# Patient Record
Sex: Female | Born: 1985 | Race: White | Hispanic: No | Marital: Single | Smoking: Never smoker
Health system: Southern US, Community
[De-identification: ages and names within clinical notes are randomized; demographics above are authoritative.]

## PROBLEM LIST (undated history)

## (undated) DIAGNOSIS — N189 Chronic kidney disease, unspecified: Secondary | ICD-10-CM

## (undated) DIAGNOSIS — F909 Attention-deficit hyperactivity disorder, unspecified type: Secondary | ICD-10-CM

## (undated) DIAGNOSIS — Q600 Renal agenesis, unilateral: Secondary | ICD-10-CM

## (undated) DIAGNOSIS — T7840XA Allergy, unspecified, initial encounter: Secondary | ICD-10-CM

## (undated) DIAGNOSIS — Z940 Kidney transplant status: Secondary | ICD-10-CM

## (undated) DIAGNOSIS — E010 Iodine-deficiency related diffuse (endemic) goiter: Secondary | ICD-10-CM

## (undated) HISTORY — DX: Attention-deficit hyperactivity disorder, unspecified type: F90.9

## (undated) HISTORY — DX: Chronic kidney disease, unspecified: N18.9

## (undated) HISTORY — PX: PORT-A-CATH REMOVAL: SHX5289

## (undated) HISTORY — DX: Iodine-deficiency related diffuse (endemic) goiter: E01.0

## (undated) HISTORY — PX: TUBAL LIGATION: SHX77

## (undated) HISTORY — DX: Allergy, unspecified, initial encounter: T78.40XA

## (undated) HISTORY — DX: Renal agenesis, unilateral: Q60.0

## (undated) HISTORY — DX: Kidney transplant status: Z94.0

## (undated) HISTORY — PX: WISDOM TOOTH EXTRACTION: SHX21

---
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
## 2003-01-06 ENCOUNTER — Other Ambulatory Visit: Admission: RE | Admit: 2003-01-06 | Discharge: 2003-01-06 | Payer: Self-pay | Admitting: Obstetrics & Gynecology

## 2003-03-09 ENCOUNTER — Encounter (HOSPITAL_COMMUNITY): Admission: RE | Admit: 2003-03-09 | Discharge: 2003-06-07 | Payer: Self-pay | Admitting: Nephrology

## 2003-03-17 ENCOUNTER — Emergency Department (HOSPITAL_COMMUNITY): Admission: EM | Admit: 2003-03-17 | Discharge: 2003-03-18 | Payer: Self-pay | Admitting: Emergency Medicine

## 2003-07-10 ENCOUNTER — Emergency Department (HOSPITAL_COMMUNITY): Admission: EM | Admit: 2003-07-10 | Discharge: 2003-07-10 | Payer: Self-pay | Admitting: Emergency Medicine

## 2003-07-12 ENCOUNTER — Encounter (HOSPITAL_COMMUNITY): Admission: RE | Admit: 2003-07-12 | Discharge: 2003-10-10 | Payer: Self-pay | Admitting: Nephrology

## 2003-09-02 ENCOUNTER — Ambulatory Visit (HOSPITAL_COMMUNITY): Admission: RE | Admit: 2003-09-02 | Discharge: 2003-09-02 | Payer: Self-pay | Admitting: Nephrology

## 2003-11-10 ENCOUNTER — Inpatient Hospital Stay (HOSPITAL_COMMUNITY): Admission: AD | Admit: 2003-11-10 | Discharge: 2003-11-11 | Payer: Self-pay | Admitting: Nephrology

## 2003-11-17 ENCOUNTER — Encounter (HOSPITAL_COMMUNITY): Admission: RE | Admit: 2003-11-17 | Discharge: 2004-02-15 | Payer: Self-pay | Admitting: Nephrology

## 2003-11-19 ENCOUNTER — Ambulatory Visit (HOSPITAL_COMMUNITY): Admission: RE | Admit: 2003-11-19 | Discharge: 2003-11-19 | Payer: Self-pay | Admitting: Vascular Surgery

## 2003-12-01 ENCOUNTER — Emergency Department (HOSPITAL_COMMUNITY): Admission: EM | Admit: 2003-12-01 | Discharge: 2003-12-02 | Payer: Self-pay | Admitting: Emergency Medicine

## 2004-01-11 HISTORY — PX: KIDNEY TRANSPLANT: SHX239

## 2005-08-21 ENCOUNTER — Ambulatory Visit: Payer: Self-pay | Admitting: Family Medicine

## 2005-11-01 ENCOUNTER — Ambulatory Visit: Payer: Self-pay | Admitting: Family Medicine

## 2005-12-17 ENCOUNTER — Ambulatory Visit: Payer: Self-pay | Admitting: Family Medicine

## 2006-01-23 ENCOUNTER — Ambulatory Visit: Payer: Self-pay | Admitting: Family Medicine

## 2006-01-23 ENCOUNTER — Encounter: Admission: RE | Admit: 2006-01-23 | Discharge: 2006-01-23 | Payer: Self-pay | Admitting: Family Medicine

## 2007-03-11 ENCOUNTER — Ambulatory Visit: Payer: Self-pay | Admitting: Family Medicine

## 2007-04-11 ENCOUNTER — Ambulatory Visit: Payer: Self-pay | Admitting: Family Medicine

## 2008-01-26 ENCOUNTER — Ambulatory Visit: Payer: Self-pay | Admitting: Family Medicine

## 2008-07-16 ENCOUNTER — Ambulatory Visit: Payer: Self-pay | Admitting: Family Medicine

## 2009-01-25 ENCOUNTER — Ambulatory Visit: Payer: Self-pay | Admitting: Family Medicine

## 2009-08-02 ENCOUNTER — Ambulatory Visit: Payer: Self-pay | Admitting: Family Medicine

## 2010-03-14 ENCOUNTER — Ambulatory Visit
Admission: RE | Admit: 2010-03-14 | Discharge: 2010-03-14 | Payer: Self-pay | Source: Home / Self Care | Attending: Family Medicine | Admitting: Family Medicine

## 2010-07-28 NOTE — Op Note (Signed)
NAME:  Michele Stone, Michele Stone                        ACCOUNT NO.:  [id number]   MEDICAL RECORD NO.:  [id number]                   PATIENT TYPE:  OIB   LOCATION:  2874                                 FACILITY:  [facility]   PHYSICIAN:  Quita Skye. Hart Rochester, M.D.               DATE OF BIRTH:  01-26-1986   DATE OF PROCEDURE:  11/19/2003  DATE OF DISCHARGE:  11/19/2003                                 OPERATIVE REPORT   PREOPERATIVE DIAGNOSIS:  End stage renal disease.   POSTOPERATIVE DIAGNOSIS:  End stage renal disease.   OPERATION:  1.  Bilateral ultrasound localization of internal jugular veins.  2.  Insertion Diatech catheter left internal jugular vein (28 cm).   SURGEON:  Quita Skye. Hart Rochester, M.D.   FIRST ASSISTANT:  Nurse   ANESTHESIA:  Local.   PROCEDURE:  The patient was taken to the operating room and placed in a  supine position at which time the upper chest and neck were exposed.  Both  internal jugular veins were imaged using B-mode ultrasound and both were  noted to be widely patent.  After prepping and draping in a routine sterile  manner, the left supraclavicular area was anesthetized with 1% Xylocaine.  The left internal jugular vein was entered using a supraclavicular approach  and the guide-wire passed into the right atrium under fluoroscopic guidance.  After dilating the tract appropriately, a 28 cm Diatech catheter was  positioned in the right atrium and then tunneled peripherally after removing  the peel away sheath.  The catheter was secured with nylon sutures and the  wound was closed with Vicryl in subcuticular fashion.  A sterile dressing  was applied.  The patient was taken to the recovery room in satisfactory  condition.                                               Quita Skye Hart Rochester, M.D.    JDL/MEDQ  D:  11/19/2003  T:  11/19/2003  Job:  [id number]

## 2010-07-28 NOTE — Op Note (Signed)
NAME:  Michele Stone, Michele Stone                        ACCOUNT NO.:  [id number]   MEDICAL RECORD NO.:  [id number]                   PATIENT TYPE:  AMB   LOCATION:  DAY                                  FACILITY:  [facility]   PHYSICIAN:  Excell Seltzer. Annabell Howells, M.D.                 DATE OF BIRTH:  27-Mar-1985   DATE OF PROCEDURE:  07/20/2002  DATE OF DISCHARGE:                                 OPERATIVE REPORT   PROCEDURE:  Cystoscopy with bilateral retrograde ureteropyelograms with  interpretation, insertion of a left double J stent.   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   SURGEON:  Excell Seltzer. Annabell Howells, M.D.   ANESTHESIA:  General.   DRAINS:  6 French 24 cm double J stent.   COMPLICATIONS:  None.   INDICATIONS FOR PROCEDURE:  Adeli is a 25 year old white female seen in  consultation by Dr. Sharlot Gowda for left hydronephrosis and a probable  solitary kidney with renal insufficiency. She had had urinary tract  infection a year and a half ago and then again had similar symptoms in  January but had no other problems until a recent [employer] physical when she was  found to have hypertension. Blood work demonstrated a BUN of 40 and a  creatinine of 3.7. A renal ultrasound was obtained which suggested a  solitary left kidney with hydronephrosis but compensatory hypertrophy. It  was felt that Cysto retrogrades and insertion of the stent was indicated to  see if that would improve her renal function.   DESCRIPTION OF PROCEDURE:  The patient was taken to the operating room, she  received a g of Ancef, she was placed in lithotomy position. Her perineum  and genitalia were prepped with Betadine solution and she was draped in the  usual sterile  fashion. Cystoscopy was performed using the 22 [place] scope  and the 12 and 70 degree lenses. Examination revealed a normal urethra, the  bladder wall had mild trabeculation without tumor, stones or inflammation.  The ureteral orifices were in their normal anatomic position, no urine was  noted to efflux from either orifice but she did have urine in her bladder.  The right ureteral orifice was cannulated with a 5 [place] open end catheter,  contrast was instilled. This study demonstrated an atretic ureter with blind  ending in the right upper quadrant. A left retrograde pyelogram was then  performed. This study demonstrated a fusiform dilation of the mid ureter  with some narrowing of the proximal ureter and apparent UPJ obstruction with  moderate hydronephrosis of the internal collecting system. No filling  defects were noted. The collecting system did not have the normal branching  appearance and it appeared to be  somewhat consistent with a mild rotated  kidney. I was concerned that I could not see obvious calices to where I  thought the lower pole should be. I reinspected the trigone and found no  evidence of ureteral duplication and also repeated the retrograde both with  the catheter in the distal ureter and in the kidney and no additional  filling was noted. An oblique film was taken at the end of the procedure to  help further visualize the anatomy and once again no obvious lower pole  caliceal structures were identified. After completion of the retrograde, a  wire was passed through the left kidney and a 6 French 24 cm double J stent  was inserted without difficulty. Upon removal of the wire, a good coil  formed in the kidney and in the bladder. At this point, the bladder was  drained, the patient was taken down from the lithotomy position, the oblique  films were obtained as noted above. Her anesthetic was reversed and she was  moved to the recovery room in stable condition and there  were no  complications.                                               Excell Seltzer. Annabell Howells, M.D.    JJW/MEDQ  D:  07/20/2002  T:  07/21/2002  Job:  [id number]   cc:   Sharlot Gowda, M.D.  [address] [place], [place] [address]  Fax: [phone]

## 2010-07-28 NOTE — Consult Note (Signed)
NAME:  Michele Stone, Michele Stone                        ACCOUNT NO.:  [id number]   MEDICAL RECORD NO.:  [id number]                   PATIENT TYPE:  INP   LOCATION:  0345                                 [facility]ACILITY:  [facility]   PHYSICIAN:  Dyke Maes, M.D.          DATE OF BIRTH:  1985/04/23   DATE OF CONSULTATION:  09/02/2002  DATE OF DISCHARGE:                                   CONSULTATION   REASON FOR CONSULTATION:  1. Chronic renal insufficiency.  2. Anemia.  3. Acidosis.   HISTORY OF PRESENT ILLNESS:  This is a 25 year old white female with a  history of chronic renal insufficiency secondary to a solitary function  dysplastic left kidney.  The patient was only recently found to have renal  insufficiency following a [employer] physical exam.  Her serum creatinine has been  around the 4 range.  Yesterday she developed acute onset of left flank pain  along with some suprapubic pain and fever.  This persisted until today and  was associated with some nausea and vomiting.  She has had some mild  dysuria.  She was seen by Dr. Annabell Howells in his office today and was admitted for  presumptive diagnosis of pyelonephritis.  Apparently she underwent a CT scan  of her abdomen which showed no acute process.   PAST MEDICAL HISTORY:  1. Chronic renal insufficiency.  2. Anemia secondary to chronic renal insufficiency.  3. Hypertension, most likely secondary to her chronic renal insufficiency.   ALLERGIES:  None.   MEDICATIONS:  Micardis 40 mg daily.  1. Ferrous sulfate 325 mg b.i.d.  2. Aranesp 60 mcg every other week.   SOCIAL HISTORY:  She is a nonsmoker, nondrinker.  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   FAMILY HISTORY:  No family history of end-stage renal disease, or  hypertension.  There is a family history of kidney stones.   REVIEW OF SYSTEMS:  No recent change in vision.  No shortness of breath.  No  chest pain.  No change in bowel habits.  No neuropathic  symptoms.  No new  arthritic complaints.  The rest of the review of systems is as noted in the  HPI.   PHYSICAL EXAMINATION:  VITAL SIGNS:  Blood pressure 109/61, pulse 109,  respirations 20, temperature 100.9.  GENERAL:  A pale, ill-appearing 25 year old black female somewhat somnolent,  as a result of getting IV morphine.  HEENT:  Sclerae nonicteric.  Extraocular muscles are intact.  LUNGS:  Clear to auscultation.  HEART:  Regular rate and rhythm without murmur, rub or gallop.  BACK:  Left CVA tenderness.  ABDOMEN:  Positive bowel sounds, nontender, nondistended, no  hepatosplenomegaly.  There is some mild suprapubic tenderness.  Pulses are  2/4 and equal throughout.  EXTREMITIES:  No cyanosis, clubbing or edema.  NEUROLOGIC:  Cranial nerves intact.  Motor and sensory intact.   LABORATORY DATA:  Hemoglobin 9.9,  white count 10.5, potassium 4.5, sodium  136, bicarb 15, BUN 48, creatinine 4.3, calcium 8.6.  Cultures and  urinalysis are pending.   IMPRESSION:  1. Acute pyelonephritis.  2. Chronic renal insufficiency.  3. Hypertension.  4. Anemia on Aranesp.  5. Metabolic acidosis secondary to chronic renal insufficiency.   PLAN:  1. I will add bicarb to her intravenous fluids.  2. Will hold her Micardis for now during this acute illness.  3. We will continue her Aranesp injections.  4. We will await the culture results and urinalysis/  5. I agree with beginning empiric antibiotics.  6. We will recheck laboratory in the morning.   Thank you for asking me to consult.  I will follow the patient with you.     Dyke Maes, M.D.                Dyke Maes, M.D.    MTM/MEDQ  D:  09/02/2002  T:  09/02/2002  Job:  [id number]

## 2010-10-27 ENCOUNTER — Encounter: Payer: Self-pay | Admitting: Medical

## 2010-10-27 ENCOUNTER — Ambulatory Visit (INDEPENDENT_AMBULATORY_CARE_PROVIDER_SITE_OTHER): Payer: Medicare Other | Admitting: Medical

## 2010-10-27 VITALS — BP 94/68 | HR 68 | Temp 97.5°F | Resp 20 | Ht 63.0 in | Wt 154.0 lb

## 2010-10-27 DIAGNOSIS — J020 Streptococcal pharyngitis: Secondary | ICD-10-CM

## 2010-10-27 MED ORDER — AMOXICILLIN 875 MG PO TABS: 875.0000 mg | ORAL_TABLET | Freq: Two times a day (BID) | ORAL | Status: AC

## 2010-10-27 NOTE — Progress Notes (Signed)
Subjective:   HPI Michele Stone is a 25 y.o. female who presents for 3 day history of bad sore throat.  She notes and chills, ear pressure, otherwise no symptoms. Using Tylenol when necessary.  No other aggravating or relieving factors.  No other c/o.  The following portions of the patient's history were reviewed and updated as appropriate: allergies, current medications, past family history, past medical history, past social history, past surgical history and problem list.  Past Medical History  Diagnosis Date  . ADHD (attention deficit hyperactivity disorder)   . Allergy   . Chronic renal failure   . History of renal transplant   . Thyromegaly   . Congenital single kidney     Review of Systems Gen.: No fevers, sweats Skin: No rash HEENT: No runny nose or sinus pressure or congestion Lungs: No cough, shortness of breath Abdomen: No nausea, vomiting, abdominal pain, diarrhea     Objective:   Physical Exam  General appearance: alert, no distress, WD/WN, white female, hoarse voice Skin: warm, dry HEENT: Sinuses nontender, conjunctiva pink, nares patent, TMs pearly, pharynx with erythema, tonsils 2+, no exudate Lungs: Clear to auscultation bilaterally Heart: Regular in rhythm, normal S1, S2    Assessment :    Encounter Diagnosis  Name Primary?  . Strep pharyngitis Yes     Plan:    Prescription for amoxicillin today.  Advise she is contagious for at least 24 hours on antibiotic, saltwater gargles, rest, Tylenol when necessary, discussed prevention.  Unfortunately she has 3 small children who are risk of getting this as well.  Advised if they start having symptoms to call or bring them in.

## 2010-11-06 ENCOUNTER — Other Ambulatory Visit (INDEPENDENT_AMBULATORY_CARE_PROVIDER_SITE_OTHER): Payer: Medicare Other | Admitting: Medical

## 2010-11-06 DIAGNOSIS — J029 Acute pharyngitis, unspecified: Secondary | ICD-10-CM

## 2011-03-26 DIAGNOSIS — N189 Chronic kidney disease, unspecified: Secondary | ICD-10-CM | POA: Diagnosis not present

## 2011-03-26 DIAGNOSIS — Z940 Kidney transplant status: Secondary | ICD-10-CM | POA: Diagnosis not present

## 2011-04-04 DIAGNOSIS — N189 Chronic kidney disease, unspecified: Secondary | ICD-10-CM | POA: Diagnosis not present

## 2011-05-07 DIAGNOSIS — N182 Chronic kidney disease, stage 2 (mild): Secondary | ICD-10-CM | POA: Diagnosis not present

## 2011-05-07 DIAGNOSIS — Z940 Kidney transplant status: Secondary | ICD-10-CM | POA: Diagnosis not present

## 2011-05-07 DIAGNOSIS — T8610 Unspecified complication of kidney transplant: Secondary | ICD-10-CM | POA: Diagnosis not present

## 2011-05-07 DIAGNOSIS — Z87448 Personal history of other diseases of urinary system: Secondary | ICD-10-CM | POA: Diagnosis not present

## 2011-05-07 DIAGNOSIS — Z87410 Personal history of cervical dysplasia: Secondary | ICD-10-CM | POA: Diagnosis not present

## 2011-05-07 DIAGNOSIS — E559 Vitamin D deficiency, unspecified: Secondary | ICD-10-CM | POA: Diagnosis not present

## 2011-05-07 DIAGNOSIS — Z79899 Other long term (current) drug therapy: Secondary | ICD-10-CM | POA: Diagnosis not present

## 2011-05-07 DIAGNOSIS — D899 Disorder involving the immune mechanism, unspecified: Secondary | ICD-10-CM | POA: Diagnosis not present

## 2011-05-07 DIAGNOSIS — Z8719 Personal history of other diseases of the digestive system: Secondary | ICD-10-CM | POA: Diagnosis not present

## 2011-05-07 DIAGNOSIS — Z9851 Tubal ligation status: Secondary | ICD-10-CM | POA: Diagnosis not present

## 2011-06-27 DIAGNOSIS — N946 Dysmenorrhea, unspecified: Secondary | ICD-10-CM | POA: Diagnosis not present

## 2011-06-27 DIAGNOSIS — R87619 Unspecified abnormal cytological findings in specimens from cervix uteri: Secondary | ICD-10-CM | POA: Diagnosis not present

## 2011-06-28 ENCOUNTER — Ambulatory Visit (INDEPENDENT_AMBULATORY_CARE_PROVIDER_SITE_OTHER): Payer: Medicare Other | Admitting: Family Medicine

## 2011-06-28 ENCOUNTER — Encounter: Payer: Self-pay | Admitting: Family Medicine

## 2011-06-28 VITALS — BP 98/60 | HR 92 | Temp 98.6°F | Ht 63.0 in | Wt 167.0 lb

## 2011-06-28 DIAGNOSIS — N390 Urinary tract infection, site not specified: Secondary | ICD-10-CM

## 2011-06-28 DIAGNOSIS — R3915 Urgency of urination: Secondary | ICD-10-CM

## 2011-06-28 DIAGNOSIS — N12 Tubulo-interstitial nephritis, not specified as acute or chronic: Secondary | ICD-10-CM

## 2011-06-28 DIAGNOSIS — M545 Low back pain: Secondary | ICD-10-CM

## 2011-06-28 DIAGNOSIS — Z940 Kidney transplant status: Secondary | ICD-10-CM

## 2011-06-28 DIAGNOSIS — J309 Allergic rhinitis, unspecified: Secondary | ICD-10-CM

## 2011-06-28 LAB — POCT URINALYSIS DIPSTICK
Ketones, UA: NEGATIVE
Urobilinogen, UA: NEGATIVE
pH, UA: 5

## 2011-06-28 MED ORDER — CIPROFLOXACIN HCL 500 MG PO TABS: 500.0000 mg | ORAL_TABLET | Freq: Two times a day (BID) | ORAL | Status: AC

## 2011-06-28 NOTE — Patient Instructions (Addendum)
Please ask your kidney doctor at [facility] to periodically send [place] updates.  Return in 2 weeks to have urine repeated, to ensure infection has cleared. Take antibiotics twice daily as directed.  Return immediately if nausea, vomiting, persistent fevers, unable to keep down the antibiotics.  Take either Claritin or Allegra or Zyrtec for allergies.  Sinus rinses or Netipot will also help with sinus pressure.  You may use a decongestant such as sudafed if needed--only take this if having significant sinus pain.

## 2011-06-28 NOTE — Progress Notes (Signed)
Chief complaint: thinks she may have sinus infection or allergies x 7 days. Also has fever and thinks she may have UTI, has lower back pain and urinary urgency  HPI:  Last night began with fever (>104), urinary frequency, urgency.  Denies dysuria.  Urine has looked dark, no blood seen.  Denies nausea, vomiting.  Had just slight tenderness over lower abdomen yesterday, but thought related to working out abs at gym.  Also having some L flank and low back pain since last night.  She has h/o kidney transplant (R lower abdomen), and a single, nonfunctioning kidney L side. Took Tylenol last night, didn't seem to help, fever stayed up.  Didn't take anything this morning.  Felt fever break this morning.  Also complaining of sinus pressure.  They just laid hay and grass in her front yard last week, and was fine prior to that.  Denies runny nose or sneezing, just postnasal drainage. + sore throat.  Denies cough, shortness of breath.  Benadryl allergy has helped some, and avoids going outside.  Past Medical History  Diagnosis Date  . ADHD (attention deficit hyperactivity disorder)   . Allergy   . Chronic renal failure   . History of renal transplant   . Thyromegaly   . Congenital single kidney    Past Surgical History  Procedure Date  . Kidney transplant 01/2004   History   Social History  . Marital Status: Single    Spouse Name: N/A    Number of Children: N/A  . Years of Education: N/A   Occupational History  . Not on file.   Social History Main Topics  . Smoking status: Never Smoker   . Smokeless tobacco: Never Used  . Alcohol Use: No  . Drug Use: No  . Sexually Active: Not on file   Other Topics Concern  . Not on file   Social History Narrative  . No narrative on file   Current Outpatient Prescriptions on File Prior to Visit  Medication Sig Dispense Refill  . azaTHIOprine (IMURAN) 50 MG tablet Take 75 mg by mouth daily.        . ferrous sulfate 325 (65 FE) MG tablet Take 325  mg by mouth daily with breakfast.        . folic acid (FOLVITE) 1 MG tablet Take 1 mg by mouth daily.        . Prenatal Vit-Fe Fumarate-FA (PRENATAL/FOLIC ACID) TABS Take 1 tablet by mouth daily.        . promethazine (PHENERGAN) 25 MG tablet Take 25 mg by mouth every 6 (six) hours as needed.        . selenium sulfide (SELSUN) 2.5 % shampoo Apply 1 application topically daily as needed.        . tacrolimus (PROGRAF) 1 MG capsule Take 2 mg by mouth 2 (two) times daily.       Marland Kitchen acetaminophen (TYLENOL) 325 MG tablet Take 325 mg by mouth every 6 (six) hours as needed.         Allergies  Allergen Reactions  . Myfortic Nausea And Vomiting and Other (See Comments)    Fever.  . Adhesive (Tape) Rash    ROS:  Denies headaches, nausea, vomiting, diarrhea, skin rashes. Denies cough, shortness of breath, chest pain.  See HPI:  Chart reviewed--no records from [facility] since 2009, but patient reports recent labs were within normal range.  PHYSICAL EXAM: BP 98/60  Pulse 92  Temp(Src) 98.6 F (37 C) (Oral)  Ht  5\' 3"  (1.6 m)  Wt 167 lb (75.751 kg)  BMI 29.58 kg/m2  LMP 06/08/2011 Well developed, pleasant female, in no distress, accompanied by 3 young children.  Back: +TTP L flank, and also at lower L paraspinous muscles.  Spine nontender.   Abdomen: soft.  She reports mild tenderness across lower abdomen, including RL abdomen where kidney is present.  No overlying warmth, swelling. Skin: no rash HEENT:  PERRL, conjunctiva clear.  Nasal mucosa mildly edematous, no purulence.  Sinuses nontender.  OP clear. Neck: no lymphadenopathy or thyromeglay Heart: borderline tachycardia.  No murmur, rub Lungs: clear bilaterally   Urine dip:  1+ blood, 2+ leuks, +nitrate  ASSESSMENT/PLAN: 1. Urinary urgency  POCT Urinalysis Dipstick  2. Low back pain  POCT Urinalysis Dipstick  3. UTI (lower urinary tract infection)    4. Pyelonephritis  ciprofloxacin (CIPRO) 500 MG tablet, Urine culture  5. H/O kidney  transplant    6. Allergic rhinitis, cause unspecified     Treat presumptively for possible pyelo--seems more like infection is in nonfunctioning kidney, not transplanted kidney, based on her discomfort/pain. Return in 2 weeks to have urine repeated, to ensure infection has cleared. Take antibiotics twice daily as directed.  Return immediately if nausea, vomiting, persistent fevers, unable to keep down the antibiotics.  Take either Claritin or Allegra or Zyrtec for allergies.  Sinus rinses or Netipot will also help with sinus pressure.  You may use a decongestant such as sudafed if needed--only take this if having significant sinus pain.  Appears to also have some muscular component to her back/abdominal pain.  Heat, tylenol as needed

## 2011-07-01 LAB — URINE CULTURE

## 2011-09-06 ENCOUNTER — Encounter: Payer: Self-pay | Admitting: Family Medicine

## 2011-09-20 DIAGNOSIS — N189 Chronic kidney disease, unspecified: Secondary | ICD-10-CM | POA: Diagnosis not present

## 2011-11-07 DIAGNOSIS — D899 Disorder involving the immune mechanism, unspecified: Secondary | ICD-10-CM | POA: Diagnosis not present

## 2011-11-07 DIAGNOSIS — Z8719 Personal history of other diseases of the digestive system: Secondary | ICD-10-CM | POA: Diagnosis not present

## 2011-11-07 DIAGNOSIS — Z87410 Personal history of cervical dysplasia: Secondary | ICD-10-CM | POA: Diagnosis not present

## 2011-11-07 DIAGNOSIS — N182 Chronic kidney disease, stage 2 (mild): Secondary | ICD-10-CM | POA: Diagnosis not present

## 2011-11-07 DIAGNOSIS — E559 Vitamin D deficiency, unspecified: Secondary | ICD-10-CM | POA: Diagnosis not present

## 2011-11-07 DIAGNOSIS — T8610 Unspecified complication of kidney transplant: Secondary | ICD-10-CM | POA: Diagnosis not present

## 2011-12-14 DIAGNOSIS — Z8719 Personal history of other diseases of the digestive system: Secondary | ICD-10-CM | POA: Diagnosis not present

## 2011-12-14 DIAGNOSIS — R109 Unspecified abdominal pain: Secondary | ICD-10-CM | POA: Diagnosis not present

## 2011-12-28 DIAGNOSIS — R109 Unspecified abdominal pain: Secondary | ICD-10-CM | POA: Diagnosis not present

## 2011-12-28 DIAGNOSIS — N186 End stage renal disease: Secondary | ICD-10-CM | POA: Diagnosis not present

## 2011-12-28 DIAGNOSIS — Z8719 Personal history of other diseases of the digestive system: Secondary | ICD-10-CM | POA: Diagnosis not present

## 2011-12-28 DIAGNOSIS — R12 Heartburn: Secondary | ICD-10-CM | POA: Diagnosis not present

## 2011-12-28 DIAGNOSIS — R1084 Generalized abdominal pain: Secondary | ICD-10-CM | POA: Diagnosis not present

## 2012-03-28 ENCOUNTER — Encounter: Payer: Self-pay | Admitting: Medical

## 2012-03-28 ENCOUNTER — Ambulatory Visit (INDEPENDENT_AMBULATORY_CARE_PROVIDER_SITE_OTHER): Payer: Medicare Other | Admitting: Medical

## 2012-03-28 VITALS — BP 110/70 | HR 82 | Temp 98.0°F | Resp 16 | Wt 170.0 lb

## 2012-03-28 DIAGNOSIS — E669 Obesity, unspecified: Secondary | ICD-10-CM

## 2012-03-28 DIAGNOSIS — R635 Abnormal weight gain: Secondary | ICD-10-CM

## 2012-03-28 DIAGNOSIS — Z8349 Family history of other endocrine, nutritional and metabolic diseases: Secondary | ICD-10-CM

## 2012-03-28 LAB — COMPREHENSIVE METABOLIC PANEL
ALT: <8 U/L (ref 0–35)
AST: 12 U/L (ref 0–37)
Albumin: 4.1 g/dL (ref 3.5–5.2)
Alkaline Phosphatase: 50 U/L (ref 39–117)
CO2: 26 mEq/L (ref 19–32)
Calcium: 9.6 mg/dL (ref 8.4–10.5)
Creat: 0.68 mg/dL (ref 0.50–1.10)
Total Bilirubin: 0.7 mg/dL (ref 0.3–1.2)
Total Protein: 7 g/dL (ref 6.0–8.3)

## 2012-03-28 NOTE — Progress Notes (Signed)
Subjective: Here for c/o weight gain.  She notes that despite her workout routine of 1+ hours of cardio and weight lifting daily, reportedly eating 1200 calories daily, still stagnating at 170 lb since she gave birth to her last child who is now almost 29 years old.  She thinks it may be her thyroid.   She is on medications for hx/o renal transplant from birth.   Otherwise no new medications, no other new changes in diet.  Her mom does have thyroid disease.   She does see her kidney doctor regularly and things are stable in regards to her kidney.    Past Medical History  Diagnosis Date  . ADHD (attention deficit hyperactivity disorder)   . Allergy   . Chronic renal failure   . History of renal transplant   . Thyromegaly   . Congenital single kidney    Review of Systems Constitutional: -fever, -chills, -sweats ENT: -runny nose, -ear pain, -sore throat Cardiology:  -chest pain, -palpitations, -edema Respiratory: -cough, -shortness of breath, -wheezing Gastroenterology: -abdominal pain, -nausea, -vomiting, -diarrhea, -constipation  Musculoskeletal: -arthralgias, -myalgias, -joint swelling, -back pain Ophthalmology: -vision changes Urology: -dysuria, -difficulty urinating, -hematuria, -urinary frequency, -urgency Neurology: -headache, -weakness, -tingling, -numbness    Objective: Gen: wd, wn Skin: warm, dry Neck: supple, nontender, no thyromegaly, no mass Lungs clear Heart RRR, normal S1, S2, no murmurs Abdomen +bs, soft, nontender, no mass, no organomegaly, no striae, RLQ surgical scar No obvious buffalo hump, moon facies Pulses normal  Assessment: Encounter Diagnoses  Name Primary?  . Weight gain, abnormal Yes  . Family history of thyroid disease   . Obesity    Plan: Advised 1500-1800 calorie diet, discussed healthy choices, variety of exercises, c/t daily exercise, 7-8 hours of sleep, stress reduction and labs today.  i doubt thyroid or cushing or a medical problem.  Likely  diet related.  F/u pending labs.

## 2012-04-09 DIAGNOSIS — T8610 Unspecified complication of kidney transplant: Secondary | ICD-10-CM | POA: Diagnosis not present

## 2012-04-09 DIAGNOSIS — Z87410 Personal history of cervical dysplasia: Secondary | ICD-10-CM | POA: Diagnosis not present

## 2012-04-09 DIAGNOSIS — Z8719 Personal history of other diseases of the digestive system: Secondary | ICD-10-CM | POA: Diagnosis not present

## 2012-04-09 DIAGNOSIS — E559 Vitamin D deficiency, unspecified: Secondary | ICD-10-CM | POA: Diagnosis not present

## 2012-04-09 DIAGNOSIS — D899 Disorder involving the immune mechanism, unspecified: Secondary | ICD-10-CM | POA: Diagnosis not present

## 2012-04-09 DIAGNOSIS — Z9851 Tubal ligation status: Secondary | ICD-10-CM | POA: Diagnosis not present

## 2012-04-09 DIAGNOSIS — Z8742 Personal history of other diseases of the female genital tract: Secondary | ICD-10-CM | POA: Diagnosis not present

## 2012-04-09 DIAGNOSIS — N182 Chronic kidney disease, stage 2 (mild): Secondary | ICD-10-CM | POA: Diagnosis not present

## 2012-07-18 ENCOUNTER — Ambulatory Visit (INDEPENDENT_AMBULATORY_CARE_PROVIDER_SITE_OTHER): Payer: Medicare Other | Admitting: Medical

## 2012-07-18 ENCOUNTER — Encounter: Payer: Self-pay | Admitting: Medical

## 2012-07-18 VITALS — BP 102/70 | HR 109 | Temp 97.9°F | Resp 16 | Wt 174.0 lb

## 2012-07-18 DIAGNOSIS — L259 Unspecified contact dermatitis, unspecified cause: Secondary | ICD-10-CM

## 2012-07-18 MED ORDER — HYDROCORTISONE 2.5 % EX CREA: TOPICAL_CREAM | Freq: Two times a day (BID) | CUTANEOUS | Status: AC

## 2012-07-18 NOTE — Progress Notes (Signed)
Subjective:  Michele Stone is a 27 y.o. female who presents for rash in middle of her chest.   Was bubbled up the other day, is itchy, red at times . No other rash. No other symptoms.   She is allergic to the pear trees at there mom's house, but no other know environmental allergies.  No new soaps, doesn't spray cologne on her chest.  No recent insect bites.  Used OTC benadryl cream, other OTC creams, no improvement.  No other aggravating or relieving factors.    No other c/o.  The following portions of the patient's history were reviewed and updated as appropriate: allergies, current medications, past family history, past medical history, past social history, past surgical history and problem list.  ROS Otherwise as in subjective above  Objective: Physical Exam  Vital signs reviewed  General appearance: alert, no distress, WD/WN Skin: faint rough texture skin on middle of chest, at superior border of breast cleavage, but no obvious rash, erythema, crusting, or other.  Skin normal appearing except for freckles throughout   Assessment: Encounter Diagnosis  Name Primary?  . Contact dermatitis Yes     Plan: Advised for the next few days, hydrocortisone cream topically 2.5%, ice pack for itching, benadryl oral OTC 2-3 times daily for few days, bathing daily, and if not improving, recheck.  Follow up: prn.

## 2012-08-08 DIAGNOSIS — N182 Chronic kidney disease, stage 2 (mild): Secondary | ICD-10-CM | POA: Diagnosis not present

## 2012-08-08 DIAGNOSIS — D899 Disorder involving the immune mechanism, unspecified: Secondary | ICD-10-CM | POA: Diagnosis not present

## 2012-08-08 DIAGNOSIS — T8610 Unspecified complication of kidney transplant: Secondary | ICD-10-CM | POA: Diagnosis not present

## 2012-08-08 DIAGNOSIS — Z79899 Other long term (current) drug therapy: Secondary | ICD-10-CM | POA: Diagnosis not present

## 2012-08-08 DIAGNOSIS — Z87410 Personal history of cervical dysplasia: Secondary | ICD-10-CM | POA: Diagnosis not present

## 2012-08-08 DIAGNOSIS — Z8719 Personal history of other diseases of the digestive system: Secondary | ICD-10-CM | POA: Diagnosis not present

## 2012-08-08 DIAGNOSIS — E559 Vitamin D deficiency, unspecified: Secondary | ICD-10-CM | POA: Diagnosis not present

## 2012-08-08 DIAGNOSIS — Z940 Kidney transplant status: Secondary | ICD-10-CM | POA: Diagnosis not present

## 2012-10-08 DIAGNOSIS — Z01419 Encounter for gynecological examination (general) (routine) without abnormal findings: Secondary | ICD-10-CM | POA: Diagnosis not present

## 2012-10-08 DIAGNOSIS — N946 Dysmenorrhea, unspecified: Secondary | ICD-10-CM | POA: Diagnosis not present

## 2012-10-08 DIAGNOSIS — A499 Bacterial infection, unspecified: Secondary | ICD-10-CM | POA: Diagnosis not present

## 2012-10-08 DIAGNOSIS — Z940 Kidney transplant status: Secondary | ICD-10-CM | POA: Diagnosis not present

## 2012-10-08 DIAGNOSIS — Z87891 Personal history of nicotine dependence: Secondary | ICD-10-CM | POA: Diagnosis not present

## 2012-10-08 DIAGNOSIS — N760 Acute vaginitis: Secondary | ICD-10-CM | POA: Diagnosis not present

## 2012-10-08 DIAGNOSIS — Z124 Encounter for screening for malignant neoplasm of cervix: Secondary | ICD-10-CM | POA: Diagnosis not present

## 2012-11-13 DIAGNOSIS — L819 Disorder of pigmentation, unspecified: Secondary | ICD-10-CM | POA: Diagnosis not present

## 2012-11-13 DIAGNOSIS — Z940 Kidney transplant status: Secondary | ICD-10-CM | POA: Diagnosis not present

## 2012-12-10 DIAGNOSIS — Z940 Kidney transplant status: Secondary | ICD-10-CM | POA: Diagnosis not present

## 2012-12-10 DIAGNOSIS — K219 Gastro-esophageal reflux disease without esophagitis: Secondary | ICD-10-CM | POA: Diagnosis not present

## 2012-12-10 DIAGNOSIS — T8610 Unspecified complication of kidney transplant: Secondary | ICD-10-CM | POA: Diagnosis not present

## 2012-12-10 DIAGNOSIS — D899 Disorder involving the immune mechanism, unspecified: Secondary | ICD-10-CM | POA: Diagnosis not present

## 2012-12-10 DIAGNOSIS — E559 Vitamin D deficiency, unspecified: Secondary | ICD-10-CM | POA: Diagnosis not present

## 2012-12-10 DIAGNOSIS — N189 Chronic kidney disease, unspecified: Secondary | ICD-10-CM | POA: Diagnosis not present

## 2012-12-10 DIAGNOSIS — R6889 Other general symptoms and signs: Secondary | ICD-10-CM | POA: Diagnosis not present

## 2012-12-10 DIAGNOSIS — Z87440 Personal history of urinary (tract) infections: Secondary | ICD-10-CM | POA: Diagnosis not present

## 2013-03-04 ENCOUNTER — Ambulatory Visit (INDEPENDENT_AMBULATORY_CARE_PROVIDER_SITE_OTHER): Payer: Medicare Other | Admitting: Family Medicine

## 2013-03-04 ENCOUNTER — Encounter: Payer: Self-pay | Admitting: Family Medicine

## 2013-03-04 VITALS — BP 96/68 | HR 84 | Temp 98.3°F | Ht 64.0 in | Wt 172.0 lb

## 2013-03-04 DIAGNOSIS — R52 Pain, unspecified: Secondary | ICD-10-CM | POA: Diagnosis not present

## 2013-03-04 DIAGNOSIS — J209 Acute bronchitis, unspecified: Secondary | ICD-10-CM | POA: Diagnosis not present

## 2013-03-04 MED ORDER — AMOXICILLIN-POT CLAVULANATE 875-125 MG PO TABS: 1.0000 | ORAL_TABLET | Freq: Two times a day (BID) | ORAL | Status: DC

## 2013-03-04 NOTE — Patient Instructions (Signed)
   Drink plenty of fluids Restart sinus rinses Use guaifenesin and dextromethorphan for cough (ie Mucinex DM or Robitussin DM)  With all of your coughing, you are very contagious.  Avoid exposure to others, especially elderly  Flu shot is strongly encouraged

## 2013-03-04 NOTE — Progress Notes (Signed)
Chief Complaint  Patient presents with  . Cough    and congestion, runny nose, fever,body aches and diarrhea that began Friday night starting with hoarseness. Has also had a very abnormal period this month.    5 days ago she lost her voice, but otherwise felt okay.  Over the next couple of days she started coughing, decreased appetite, didn't want to get out of bed.  She didn't have any energy; denied myalgias.  Coughs up "giant chunks of neon green goo", infrequent--very hard to get up, and often feels like she is going to vomit in trying to expectorate this.  She is having pain behind her eyes bilaterally.  Nasal mucus started as clear, then changed to "neon-green/black", now going back to a little clearer in color.  She had a fever of 104 earlier this week--today is the first time her temp is <99.  Her ears have been bugging her, which usually happens when her sinuses are congested.  Having some muffled hearing.  She has used tylenol, and no other medications (due to what she is allowed to use due to her transport).  She has been doing hot showers, steam.  Tried sinus rinse but it seemed to blocked to work.    She started with diarrhea yesterday.  She typically gets diarrhea when she takes tylenol along with her other medications.  Denies any nausea or vomiting. Her 3 kids have been sick over the last week with similar upper respiratory symptoms (no diarrhea), along with high fevers).  They were sick for 2-3 days and then got better.  Her illness has been lingering a little longer.  She hasn't had a flu shot, and refuses (no one in family has had)  Past Medical History  Diagnosis Date  . ADHD (attention deficit hyperactivity disorder)   . Allergy   . Chronic renal failure   . History of renal transplant   . Thyromegaly   . Congenital single kidney    Past Surgical History  Procedure Laterality Date  . Kidney transplant  01/2004  . Cesarean section      x3  . Wisdom tooth extraction    .  Port-a-cath removal    . Tubal ligation     History   Social History  . Marital Status: Single    Spouse Name: N/A    Number of Children: N/A  . Years of Education: N/A   Occupational History  . Not on file.   Social History Main Topics  . Smoking status: Never Smoker   . Smokeless tobacco: Never Used  . Alcohol Use: No  . Drug Use: No  . Sexual Activity: Yes    Birth Control/ Protection: Surgical     Comment: tubal ligation   Other Topics Concern  . Not on file   Social History Narrative  . No narrative on file    Current outpatient prescriptions:acetaminophen (TYLENOL) 325 MG tablet, Take 650 mg by mouth every 6 (six) hours as needed. , Disp: , Rfl: ;  azaTHIOprine (IMURAN) 50 MG tablet, Take 75 mg by mouth daily.  , Disp: , Rfl: ;  hydrocortisone 2.5 % cream, Apply topically 2 (two) times daily., Disp: 30 g, Rfl: 0;  tacrolimus (PROGRAF) 1 MG capsule, Take 2 mg by mouth 2 (two) times daily. , Disp: , Rfl:   Allergies  Allergen Reactions  . Mycophenolate Sodium Nausea And Vomiting and Other (See Comments)    Fever.  . Adhesive [Tape] Rash   ROS:  +  fevers, sinus headaches, ear plugging.  No sore throat.  +shortness of breath.  No nausea, vomiting.  No bleeding, bruising, rashes.  Denies chest pain, palpitations, or joint pains  PHYSICAL EXAM BP 96/68  Pulse 84  Temp(Src) 98.3 F (36.8 C) (Oral)  Ht 5\' 4"  (1.626 m)  Wt 172 lb (78.019 kg)  BMI 29.51 kg/m2  LMP 02/27/2013  Mildly ill appearing female, coughing frequently, and blowing nose HEENT:  PERRL, EOMI, conjunctiva clear.  TM's and EAC's normal.  Nasal mucosa mildly edematous, no erythema, clear mucus.  Sinuses nontender.  OP clear, moist mucus membranes Neck: no lymphadenopathy or mass Heart: mildly tachycardic (after coughing spell, rate 95-100). Lungs: clear bilaterally.  Coughing frequently.  No wheezes, rales or ronchi appreciated Skin: no rashes   ASSESSMENT/PLAN:  Acute bronchitis - Plan:  amoxicillin-clavulanate (AUGMENTIN) 875-125 MG per tablet  Body aches - Plan: POC Influenza A&B (Binax test)  Flu shot strongly encouraged, pt declines.  Flu test was negative.  She states she always runs high fevers. Her children got over illness very quickly (2-3 days), so not likely flu, just viral syndrome  Drink plenty of fluids Restart sinus rinses Use guaifenesin and dextromethorphan for cough (ie Mucinex DM or Robitussin DM). Her list of approved meds were reviewed, and these are fine, including ABX choice.   F/u if persistent fevers, coughing, shortness of breath, or other concerns develop

## 2013-04-08 DIAGNOSIS — D899 Disorder involving the immune mechanism, unspecified: Secondary | ICD-10-CM | POA: Diagnosis not present

## 2013-04-08 DIAGNOSIS — R6889 Other general symptoms and signs: Secondary | ICD-10-CM | POA: Diagnosis not present

## 2013-04-08 DIAGNOSIS — N186 End stage renal disease: Secondary | ICD-10-CM | POA: Diagnosis not present

## 2013-04-08 DIAGNOSIS — Z79899 Other long term (current) drug therapy: Secondary | ICD-10-CM | POA: Diagnosis not present

## 2013-04-08 DIAGNOSIS — Z940 Kidney transplant status: Secondary | ICD-10-CM | POA: Diagnosis not present

## 2013-04-08 DIAGNOSIS — Z48298 Encounter for aftercare following other organ transplant: Secondary | ICD-10-CM | POA: Diagnosis not present

## 2013-07-16 DIAGNOSIS — T8610 Unspecified complication of kidney transplant: Secondary | ICD-10-CM | POA: Diagnosis not present

## 2013-07-16 DIAGNOSIS — Z940 Kidney transplant status: Secondary | ICD-10-CM | POA: Diagnosis not present

## 2013-07-16 DIAGNOSIS — Z79899 Other long term (current) drug therapy: Secondary | ICD-10-CM | POA: Diagnosis not present

## 2013-11-19 ENCOUNTER — Ambulatory Visit (INDEPENDENT_AMBULATORY_CARE_PROVIDER_SITE_OTHER): Payer: Medicare Other | Admitting: Family Medicine

## 2013-11-19 ENCOUNTER — Encounter: Payer: Self-pay | Admitting: Family Medicine

## 2013-11-19 VITALS — BP 100/60 | HR 93 | Wt 181.0 lb

## 2013-11-19 DIAGNOSIS — F919 Conduct disorder, unspecified: Secondary | ICD-10-CM

## 2013-11-19 DIAGNOSIS — R4689 Other symptoms and signs involving appearance and behavior: Secondary | ICD-10-CM

## 2013-11-19 NOTE — Progress Notes (Signed)
   Subjective:    Patient ID: Michele Stone, female    DOB: April 17, 1985, 28 y.o.   MRN: [id number]  HPI She is here for consult concerning difficulty with behavior problems with her 2 older children. This has been going on for quite some time. She has tried various behavior techniques, none of which have been helpful. Apparently the children do quite well in school and there have been no behavior issues mentioned from school. The children apparently throw temper tantrums. When she initiates a behavior techniques, their usual responses I don't care. Her husband is gone out of town working so the behavior is usually mainly for her.   Review of Systems     Objective:   Physical Exam Alert and in no distress otherwise not examined.       Assessment & Plan:  Behavior problem in child  I discussed various behavior techniques with her including timeout, not allowing the children to engage continual she to avoid fighting also discussed getting them to buy into rewards and placements by choosing between several different rewards or placements. I Will also refer to Dr. Inda Coke, [phone] for further help with this matter

## 2013-11-26 DIAGNOSIS — Z79899 Other long term (current) drug therapy: Secondary | ICD-10-CM | POA: Diagnosis not present

## 2013-11-26 DIAGNOSIS — Z940 Kidney transplant status: Secondary | ICD-10-CM | POA: Diagnosis not present

## 2013-11-26 DIAGNOSIS — D899 Disorder involving the immune mechanism, unspecified: Secondary | ICD-10-CM | POA: Diagnosis not present

## 2013-11-26 DIAGNOSIS — B079 Viral wart, unspecified: Secondary | ICD-10-CM | POA: Diagnosis not present

## 2013-11-26 DIAGNOSIS — Z87410 Personal history of cervical dysplasia: Secondary | ICD-10-CM | POA: Diagnosis not present

## 2013-11-26 DIAGNOSIS — L819 Disorder of pigmentation, unspecified: Secondary | ICD-10-CM | POA: Diagnosis not present

## 2013-11-26 DIAGNOSIS — T8610 Unspecified complication of kidney transplant: Secondary | ICD-10-CM | POA: Diagnosis not present

## 2013-11-26 DIAGNOSIS — R109 Unspecified abdominal pain: Secondary | ICD-10-CM | POA: Diagnosis not present

## 2013-11-26 DIAGNOSIS — N186 End stage renal disease: Secondary | ICD-10-CM | POA: Diagnosis not present

## 2013-11-30 DIAGNOSIS — Z48298 Encounter for aftercare following other organ transplant: Secondary | ICD-10-CM | POA: Diagnosis not present

## 2013-11-30 DIAGNOSIS — N390 Urinary tract infection, site not specified: Secondary | ICD-10-CM | POA: Diagnosis not present

## 2013-11-30 DIAGNOSIS — Z940 Kidney transplant status: Secondary | ICD-10-CM | POA: Diagnosis not present

## 2014-03-08 DIAGNOSIS — Z3041 Encounter for surveillance of contraceptive pills: Secondary | ICD-10-CM | POA: Diagnosis not present

## 2014-03-08 DIAGNOSIS — N946 Dysmenorrhea, unspecified: Secondary | ICD-10-CM | POA: Diagnosis not present

## 2014-04-08 DIAGNOSIS — Z87440 Personal history of urinary (tract) infections: Secondary | ICD-10-CM | POA: Diagnosis not present

## 2014-04-08 DIAGNOSIS — N186 End stage renal disease: Secondary | ICD-10-CM | POA: Diagnosis not present

## 2014-04-08 DIAGNOSIS — D899 Disorder involving the immune mechanism, unspecified: Secondary | ICD-10-CM | POA: Diagnosis not present

## 2014-04-08 DIAGNOSIS — Z862 Personal history of diseases of the blood and blood-forming organs and certain disorders involving the immune mechanism: Secondary | ICD-10-CM | POA: Diagnosis not present

## 2014-04-08 DIAGNOSIS — Z87410 Personal history of cervical dysplasia: Secondary | ICD-10-CM | POA: Diagnosis not present

## 2014-04-08 DIAGNOSIS — Z940 Kidney transplant status: Secondary | ICD-10-CM | POA: Diagnosis not present

## 2014-04-08 DIAGNOSIS — Z79899 Other long term (current) drug therapy: Secondary | ICD-10-CM | POA: Diagnosis not present

## 2014-04-08 DIAGNOSIS — T8610 Unspecified complication of kidney transplant: Secondary | ICD-10-CM | POA: Diagnosis not present

## 2014-04-08 DIAGNOSIS — Z4822 Encounter for aftercare following kidney transplant: Secondary | ICD-10-CM | POA: Diagnosis not present

## 2014-05-11 ENCOUNTER — Telehealth: Payer: Self-pay | Admitting: Internal Medicine

## 2014-05-11 NOTE — Telephone Encounter (Signed)
Faxed over medical records to [facility] @ [phone] 

## 2014-06-03 DIAGNOSIS — Z9851 Tubal ligation status: Secondary | ICD-10-CM | POA: Diagnosis not present

## 2014-06-03 DIAGNOSIS — Z4822 Encounter for aftercare following kidney transplant: Secondary | ICD-10-CM | POA: Diagnosis not present

## 2014-06-03 DIAGNOSIS — Z862 Personal history of diseases of the blood and blood-forming organs and certain disorders involving the immune mechanism: Secondary | ICD-10-CM | POA: Diagnosis not present

## 2014-06-03 DIAGNOSIS — N189 Chronic kidney disease, unspecified: Secondary | ICD-10-CM | POA: Diagnosis not present

## 2014-06-03 DIAGNOSIS — Z79899 Other long term (current) drug therapy: Secondary | ICD-10-CM | POA: Diagnosis not present

## 2014-06-03 DIAGNOSIS — Z87440 Personal history of urinary (tract) infections: Secondary | ICD-10-CM | POA: Diagnosis not present

## 2014-06-03 DIAGNOSIS — Z87898 Personal history of other specified conditions: Secondary | ICD-10-CM | POA: Diagnosis not present

## 2014-06-03 DIAGNOSIS — Z940 Kidney transplant status: Secondary | ICD-10-CM | POA: Diagnosis not present

## 2014-06-03 DIAGNOSIS — D899 Disorder involving the immune mechanism, unspecified: Secondary | ICD-10-CM | POA: Diagnosis not present

## 2014-06-08 DIAGNOSIS — Z0279 Encounter for issue of other medical certificate: Secondary | ICD-10-CM

## 2014-10-26 ENCOUNTER — Telehealth: Payer: Self-pay

## 2014-10-26 NOTE — Telephone Encounter (Signed)
Have her set up a med check appointment 

## 2014-10-26 NOTE — Telephone Encounter (Signed)
Pt called saying she needs a new referral for her dermatologist. Wants to know if she needs to come in for the referral. Her Dermatologist is Dr. Elwin Sleight at [facility].

## 2014-10-27 NOTE — Telephone Encounter (Signed)
Left word for word message on pt machine 

## 2014-11-04 ENCOUNTER — Encounter: Payer: Self-pay | Admitting: Family Medicine

## 2014-11-04 ENCOUNTER — Ambulatory Visit (INDEPENDENT_AMBULATORY_CARE_PROVIDER_SITE_OTHER): Payer: Medicaid Other | Admitting: Family Medicine

## 2014-11-04 VITALS — BP 90/60 | HR 65 | Wt 181.0 lb

## 2014-11-04 DIAGNOSIS — Z79899 Other long term (current) drug therapy: Secondary | ICD-10-CM | POA: Diagnosis not present

## 2014-11-04 DIAGNOSIS — Z940 Kidney transplant status: Secondary | ICD-10-CM | POA: Insufficient documentation

## 2014-11-04 NOTE — Progress Notes (Signed)
   Subjective:    Patient ID: Michele Stone, female    DOB: 10/13/1985, 29 y.o.   MRN: [id number]  HPI She is here for consult concerning referral to dermatology. She does have underlying history of kidney transplant and presently is on immunosuppressive medications requiring dermatologic follow-up. She has been doing this yearly. She is doing quite well on her medications and has no particular concerns or complaints.   Review of Systems     Objective:   Physical Exam Alert and in no distress otherwise not examined       Assessment & Plan:  H/O kidney transplant  Immunosuppressive management encounter following kidney transplant - Plan: Ambulatory referral to Dermatology

## 2014-12-14 ENCOUNTER — Encounter: Payer: Self-pay | Admitting: Family Medicine

## 2015-01-19 ENCOUNTER — Encounter: Payer: Self-pay | Admitting: Medical

## 2015-01-19 ENCOUNTER — Ambulatory Visit (INDEPENDENT_AMBULATORY_CARE_PROVIDER_SITE_OTHER): Payer: Medicaid Other | Admitting: Medical

## 2015-01-19 VITALS — BP 110/72 | HR 100 | Resp 16 | Wt 177.0 lb

## 2015-01-19 DIAGNOSIS — Z940 Kidney transplant status: Secondary | ICD-10-CM

## 2015-01-19 DIAGNOSIS — R5382 Chronic fatigue, unspecified: Secondary | ICD-10-CM | POA: Diagnosis not present

## 2015-01-19 DIAGNOSIS — R1084 Generalized abdominal pain: Secondary | ICD-10-CM | POA: Diagnosis not present

## 2015-01-19 LAB — POCT URINALYSIS DIPSTICK
Bilirubin, UA: NEGATIVE
Glucose, UA: NEGATIVE
PH UA: 6
UROBILINOGEN UA: NEGATIVE

## 2015-01-19 MED ORDER — CIPROFLOXACIN HCL 500 MG PO TABS
500.0000 mg | ORAL_TABLET | Freq: Two times a day (BID) | ORAL | Status: AC
Start: 2015-01-19 — End: ?

## 2015-01-19 NOTE — Progress Notes (Signed)
Subjective: Chief Complaint  Patient presents with  . mult. issues    no fever, but will wake up drenched in sweat. Doesn't feel good. Saw nephrologist, all they saw was that the RBC count was low. Gets lightheaded easily. Body temp changes quickly. "feels like i've been punched" 6-7/10 pain  . skin cancer    had "basal cell carcinoma nodular type" removed from face last week   Here for not feeling good.  Hasn't felt right the last 2 weeks.   Left school early the other day not feeling right.  Took some tylenol when period started 2 days ago.   She reports pain in right lower quadrant where her kidney transplant resides.  No swelling, but feels bruised.  Had pain all day yesterday, feels drained and not herself, has no energy.  She called her transplant doctor yesterday, had labs which showed slightly low RBC, but BMET and CBC otherwise normal.   Been having some runny nose, cough, coughing to the point of vomiting at times.  No fever, but awakes with chills, but + sweats.   No back pain.  No joint swelling.  No urinary.  No blood.  Having bad cramps with menstruation.   No hx/o pelvic organ issues such as ovarian cysts or fibroids.  Last UTI long time ago.  No diarrhea.  No nausea or vomiting.  No other aggravating or relieving factors. No other complaint.  Past Medical History  Diagnosis Date  . ADHD (attention deficit hyperactivity disorder)   . Allergy   . Chronic renal failure   . History of renal transplant   . Thyromegaly   . Congenital single kidney    ROS as in subjective   Objective: BP 110/72 mmHg  Pulse 100  Resp 16  Wt 177 lb (80.287 kg)  Gen: wd, wn, nad Skin: warm, dry Heart: RRR, normal s1, s2, no murmurs Lungs clear HENT - nares with congestion, otherwise unremarkable Back; non tender Abdomen: +BS, soft, surgical scar RLQ, and fullness in RLQ c/w transplanted kidney, tender RLQ, but no swelling, no other mass, tenderness or organomegaly Ext: no  edema   Assessment: Encounter Diagnoses  Name Primary?  . Chronic fatigue Yes  . Generalized abdominal pain   . History of renal transplant     Plan: Etiology likely UTI given symptoms and exam.   Reviewed the labs done by transplant team yesterday.   Reviewed them on her phone.   UA abnormal today.  She also has URI symptoms.  Begin Cipro, hydrate well, rest, and will send urine for culture.   reviewed her list of approved medications from transplant team. F/u pending labs

## 2015-01-21 LAB — URINE CULTURE

## 2015-01-24 ENCOUNTER — Telehealth: Payer: Self-pay

## 2015-01-24 NOTE — Telephone Encounter (Signed)
Pt informed that her appt has been canceled and she does not have to come in since she is doing so much better per shane

## 2015-01-26 ENCOUNTER — Ambulatory Visit: Payer: Medicaid Other | Admitting: Medical

## 2015-02-21 ENCOUNTER — Encounter: Payer: Self-pay | Admitting: Family Medicine

## 2015-02-21 ENCOUNTER — Ambulatory Visit
Admission: RE | Admit: 2015-02-21 | Discharge: 2015-02-21 | Disposition: A | Discharge status: Home / Self Care | Payer: Medicaid Other | Source: Ambulatory Visit | Attending: Family Medicine | Admitting: Family Medicine

## 2015-02-21 ENCOUNTER — Ambulatory Visit (INDEPENDENT_AMBULATORY_CARE_PROVIDER_SITE_OTHER): Payer: Medicaid Other | Admitting: Family Medicine

## 2015-02-21 VITALS — BP 110/78 | HR 72 | Temp 98.6°F | Ht 64.0 in | Wt 178.9 lb

## 2015-02-21 DIAGNOSIS — R0781 Pleurodynia: Secondary | ICD-10-CM | POA: Diagnosis not present

## 2015-02-21 DIAGNOSIS — Z940 Kidney transplant status: Secondary | ICD-10-CM

## 2015-02-21 NOTE — Progress Notes (Signed)
   Subjective:    Patient ID: Michele Stone, female    DOB: [birth year], 29 y.o.   MRN: [id number]  HPI  she has a 4 day history of symptoms started with nasal congestion followed by cough and now right-sided chest pain. No sore throat, earache, fever or chills. She did have blood work drawn 2 weeks ago for her underlying treatment for kidney transplant and apparently the tests were normal.   Review of Systems     Objective:   Physical Exam Alert and in no distress. Tympanic membranes and canals are normal. Pharyngeal area is normal. Neck is supple without adenopathy or thyromegaly. Cardiac exam shows a regular sinus rhythm without murmurs or gallops. Lungs are clear to auscultation. No chest wall tenderness noted.        Assessment & Plan:  Pleuritic chest pain - Plan: DG Chest 2 View  H/O kidney transplant  the chest pain is bothersome especially in the present situation. I will wait on the results before further evaluation and treatment

## 2016-07-24 IMAGING — CR DG CHEST 2V
2 series · 2 of 2 positions shown · non-contrast
Comparison: January 23, 2006.

CLINICAL DATA: Cough, right-sided pleuritic chest pain.

EXAM:
CHEST  2 VIEW

[w chest pa]
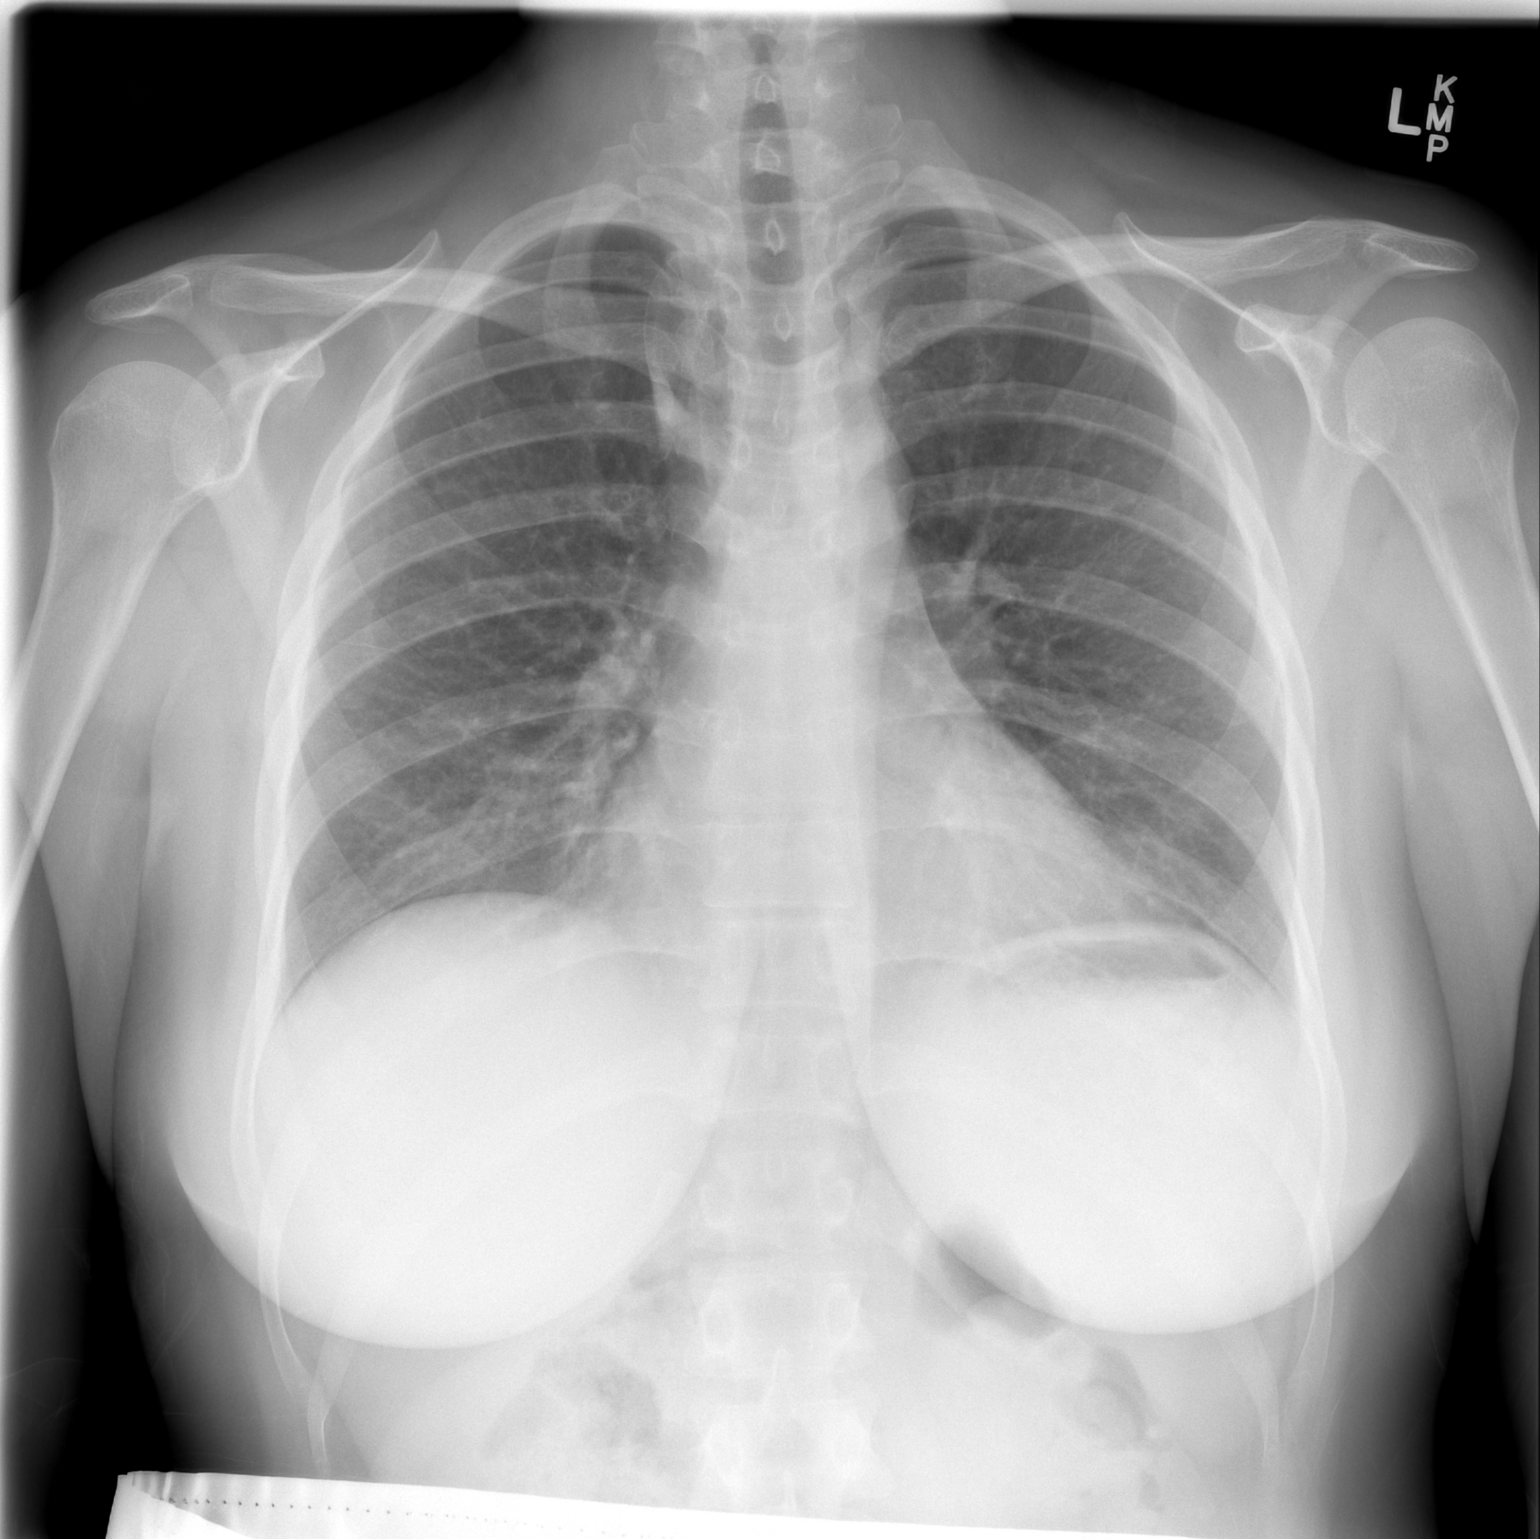

[w chest lat]
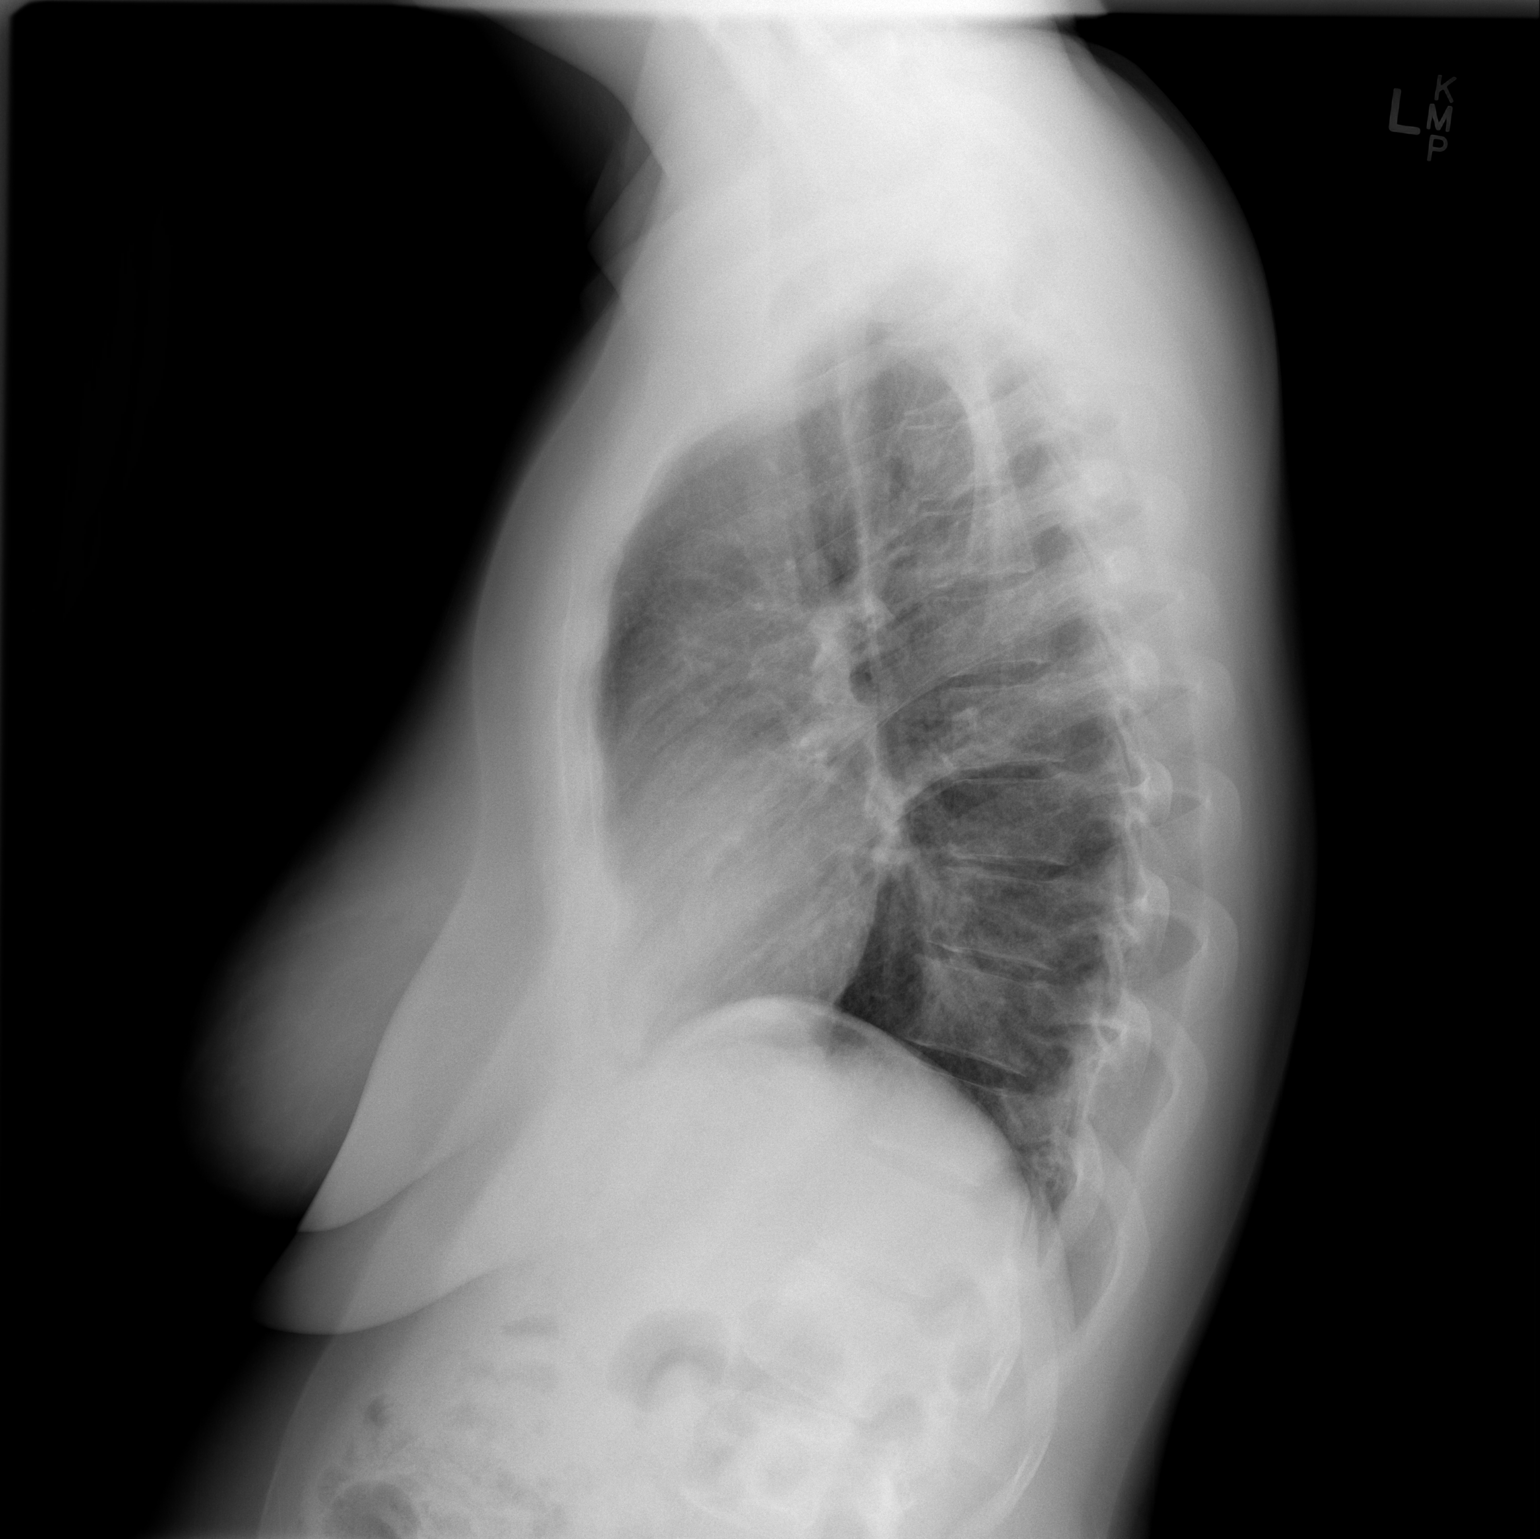

[2 of 2 positions shown; findings below may reference images not displayed]

FINDINGS: The heart size and mediastinal contours are within normal limits.
Both lungs are clear. No pneumothorax or pleural effusion is noted.
The visualized skeletal structures are unremarkable.
IMPRESSION: No active cardiopulmonary disease.

## 2016-09-06 ENCOUNTER — Encounter (HOSPITAL_COMMUNITY): Payer: Self-pay | Admitting: *Deleted

## 2016-09-06 ENCOUNTER — Ambulatory Visit (HOSPITAL_COMMUNITY)
Admission: EM | Admit: 2016-09-06 | Discharge: 2016-09-06 | Disposition: A | Discharge status: Home / Self Care | Payer: Medicaid Other | Attending: Internal Medicine | Admitting: Internal Medicine

## 2016-09-06 DIAGNOSIS — Z940 Kidney transplant status: Secondary | ICD-10-CM | POA: Insufficient documentation

## 2016-09-06 DIAGNOSIS — Z3202 Encounter for pregnancy test, result negative: Secondary | ICD-10-CM

## 2016-09-06 DIAGNOSIS — Z888 Allergy status to other drugs, medicaments and biological substances status: Secondary | ICD-10-CM | POA: Insufficient documentation

## 2016-09-06 DIAGNOSIS — Z79899 Other long term (current) drug therapy: Secondary | ICD-10-CM | POA: Insufficient documentation

## 2016-09-06 DIAGNOSIS — N390 Urinary tract infection, site not specified: Secondary | ICD-10-CM | POA: Insufficient documentation

## 2016-09-06 DIAGNOSIS — F909 Attention-deficit hyperactivity disorder, unspecified type: Secondary | ICD-10-CM | POA: Insufficient documentation

## 2016-09-06 DIAGNOSIS — R350 Frequency of micturition: Secondary | ICD-10-CM | POA: Insufficient documentation

## 2016-09-06 DIAGNOSIS — Z9889 Other specified postprocedural states: Secondary | ICD-10-CM | POA: Insufficient documentation

## 2016-09-06 DIAGNOSIS — R300 Dysuria: Secondary | ICD-10-CM | POA: Insufficient documentation

## 2016-09-06 LAB — POCT URINALYSIS DIP (DEVICE)
BILIRUBIN URINE: NEGATIVE
Glucose, UA: NEGATIVE mg/dL
KETONES UR: NEGATIVE mg/dL
NITRITE: NEGATIVE
Protein, ur: NEGATIVE mg/dL
SPECIFIC GRAVITY, URINE: 1.01 (ref 1.005–1.030)
Urobilinogen, UA: 0.2 mg/dL (ref 0.0–1.0)
pH: 7 (ref 5.0–8.0)

## 2016-09-06 LAB — POCT PREGNANCY, URINE: Preg Test, Ur: NEGATIVE

## 2016-09-06 MED ORDER — CEPHALEXIN 500 MG PO CAPS: 500.0000 mg | ORAL_CAPSULE | Freq: Four times a day (QID) | ORAL | 0 refills | Status: AC

## 2016-09-06 MED ORDER — PHENAZOPYRIDINE HCL 200 MG PO TABS: 200.0000 mg | ORAL_TABLET | Freq: Three times a day (TID) | ORAL | 0 refills | Status: AC

## 2016-09-06 MED ORDER — PHENAZOPYRIDINE HCL 200 MG PO TABS: 200.0000 mg | ORAL_TABLET | Freq: Three times a day (TID) | ORAL | 0 refills | Status: DC

## 2016-09-06 MED ORDER — CEPHALEXIN 500 MG PO CAPS: 500.0000 mg | ORAL_CAPSULE | Freq: Four times a day (QID) | ORAL | 0 refills | Status: DC

## 2016-09-06 NOTE — ED Provider Notes (Signed)
CSN: [id number]     Arrival date & time 09/06/16  1503 History   None    Chief Complaint  Patient presents with  . Urinary Tract Infection   (Consider location/radiation/quality/duration/timing/severity/associated sxs/prior Treatment) Patient c/o dysuria and urinary frequency.   The history is provided by the patient.  Urinary Tract Infection  Pain quality:  Aching Pain severity:  Mild Onset quality:  Sudden Duration:  2 days Timing:  Constant Progression:  Worsening Chronicity:  New Recent urinary tract infections: no   Relieved by:  Nothing Worsened by:  Nothing Ineffective treatments:  None tried   Past Medical History:  Diagnosis Date  . ADHD (attention deficit hyperactivity disorder)   . Allergy   . Chronic renal failure   . Congenital single kidney   . History of renal transplant   . Thyromegaly    Past Surgical History:  Procedure Laterality Date  . CESAREAN SECTION     x3  . KIDNEY TRANSPLANT  01/2004  . PORT-A-CATH REMOVAL    . TUBAL LIGATION    . WISDOM TOOTH EXTRACTION     History reviewed. No pertinent family history. Social History  Substance Use Topics  . Smoking status: Never Smoker  . Smokeless tobacco: Never Used  . Alcohol use No   OB History    No data available     Review of Systems  Constitutional: Negative.   HENT: Negative.   Eyes: Negative.   Respiratory: Negative.   Cardiovascular: Negative.   Gastrointestinal: Negative.   Endocrine: Negative.   Genitourinary: Positive for dysuria.  Allergic/Immunologic: Negative.   Neurological: Negative.     Allergies  Mycophenolate sodium; Adhesive [tape]; and Nickel  Home Medications   Prior to Admission medications   Medication Sig Start Date End Date Taking? Authorizing Provider  acetaminophen (TYLENOL) 325 MG tablet Take 650 mg by mouth every 6 (six) hours as needed.     [provider]  azaTHIOprine (IMURAN) 50 MG tablet Take 75 mg by mouth daily.      [provider]  cephALEXin (KEFLEX) 500 MG capsule Take 1 capsule (500 mg total) by mouth 4 (four) times daily. 09/06/16   Deatra Canter, FNP  ciprofloxacin (CIPRO) 500 MG tablet Take 1 tablet (500 mg total) by mouth 2 (two) times daily. Patient not taking: Reported on 02/21/2015 01/19/15   Tysinger, Kermit Balo, PA-C  hydrocortisone 2.5 % cream Apply topically 2 (two) times daily. 07/18/12   Tysinger, Kermit Balo, PA-C  Norethindrone Acetate-Ethinyl Estrad-FE (MICROGESTIN 24 FE) 1-20 MG-MCG(24) tablet Take 1 tablet by mouth daily.    [provider]  phenazopyridine (PYRIDIUM) 200 MG tablet Take 1 tablet (200 mg total) by mouth 3 (three) times daily. 09/06/16   Deatra Canter, FNP  tacrolimus (PROGRAF) 1 MG capsule Take 2 mg by mouth 2 (two) times daily.     [provider]   Meds Ordered and Administered this Visit  Medications - No data to display  BP 132/70 (BP Location: Right Arm)   Pulse 78   Temp 97.6 F (36.4 C) (Oral)   Resp 18   LMP 09/03/2016   SpO2 100%  No data found.   Physical Exam  Constitutional: She appears well-developed and well-nourished.  HENT:  Head: Normocephalic and atraumatic.  Eyes: Conjunctivae and EOM are normal. Pupils are equal, round, and reactive to light.  Neck: Normal range of motion.  Cardiovascular: Normal rate, regular rhythm and normal heart sounds.   Pulmonary/Chest: Effort normal  and breath sounds normal.  Abdominal: Soft.  Nursing note and vitals reviewed.   Urgent Care Course     Procedures (including critical care time)  Labs Review Labs Reviewed  POCT URINALYSIS DIP (DEVICE) - Abnormal; Notable for the following:       Result Value   Hgb urine dipstick SMALL (*)    Leukocytes, UA LARGE (*)    All other components within normal limits  URINE CULTURE  POCT PREGNANCY, URINE    Imaging Review No results found.   Visual Acuity Review  Right Eye Distance:   Left Eye Distance:   Bilateral Distance:    Right  Eye Near:   Left Eye Near:    Bilateral Near:         MDM   1. Lower urinary tract infectious disease   2. Dysuria    Keflex 500mg  one po qid x 7 days #28 Pyridium 200mg  one po tid x 2days #6  UA cx      Deatra CanterOxford, Devon Pretty J, FNP 09/06/16 1558

## 2016-09-06 NOTE — ED Triage Notes (Signed)
Pt  Has  A  History  Of  Kidney  Transplant   She  Reports   Symptoms      Of  Dysuria    And     Scanty  Output  With        Discomfort         In her  r  Flank     She  Reports     Urinary  Symptoms     Began  sev  Weeks  Ago  But  The  Flank  Pain started  Today

## 2016-09-07 LAB — URINE CULTURE

## 2016-11-26 LAB — HM PAP SMEAR

## 2018-01-01 ENCOUNTER — Telehealth: Payer: Self-pay | Admitting: Family Medicine

## 2018-01-01 NOTE — Telephone Encounter (Signed)
Received requested records from [facility]. Sending back for review.

## 2018-03-26 ENCOUNTER — Ambulatory Visit: Payer: BLUE CROSS/BLUE SHIELD | Admitting: Family Medicine

## 2018-03-26 ENCOUNTER — Encounter: Payer: Self-pay | Admitting: Family Medicine

## 2018-03-26 VITALS — BP 110/70 | HR 78 | Temp 97.7°F | Wt 183.2 lb

## 2018-03-26 DIAGNOSIS — T24212A Burn of second degree of left thigh, initial encounter: Secondary | ICD-10-CM | POA: Diagnosis not present

## 2018-03-26 DIAGNOSIS — Z940 Kidney transplant status: Secondary | ICD-10-CM | POA: Diagnosis not present

## 2018-03-26 DIAGNOSIS — Z79899 Other long term (current) drug therapy: Secondary | ICD-10-CM | POA: Diagnosis not present
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
## 2018-03-28 ENCOUNTER — Telehealth: Payer: Self-pay | Admitting: Family Medicine

## 2018-03-28 MED ORDER — SILVER SULFADIAZINE 1 % EX CREA: 1.0000 "application " | TOPICAL_CREAM | Freq: Every day | CUTANEOUS | 0 refills | Status: AC
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
## 2018-03-28 NOTE — Telephone Encounter (Signed)
[facility] [place]

## 2018-03-31 ENCOUNTER — Encounter: Payer: Self-pay | Admitting: Family Medicine

## 2018-03-31 ENCOUNTER — Ambulatory Visit: Payer: BLUE CROSS/BLUE SHIELD | Admitting: Family Medicine

## 2018-03-31 VITALS — BP 112/76 | HR 98 | Temp 97.9°F | Wt 181.4 lb

## 2018-03-31 DIAGNOSIS — T24212A Burn of second degree of left thigh, initial encounter: Secondary | ICD-10-CM
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
## 2018-11-05 ENCOUNTER — Other Ambulatory Visit: Payer: Self-pay

## 2018-11-05 ENCOUNTER — Ambulatory Visit (INDEPENDENT_AMBULATORY_CARE_PROVIDER_SITE_OTHER): Payer: 59 | Admitting: Family Medicine

## 2018-11-05 ENCOUNTER — Encounter: Payer: Self-pay | Admitting: Family Medicine

## 2018-11-05 VITALS — Temp 101.0°F | Wt 181.0 lb

## 2018-11-05 DIAGNOSIS — N3000 Acute cystitis without hematuria: Secondary | ICD-10-CM

## 2018-11-05 DIAGNOSIS — Z940 Kidney transplant status: Secondary | ICD-10-CM

## 2018-11-05 MED ORDER — SULFAMETHOXAZOLE-TRIMETHOPRIM 800-160 MG PO TABS: 1.0000 | ORAL_TABLET | Freq: Two times a day (BID) | ORAL | 0 refills | Status: AC

## 2018-11-05 NOTE — Progress Notes (Signed)
   Subjective:    Patient ID: Namya L [place], female    DOB: Aug 05, 1985, 33 y.o.   MRN: [id number]  HPI She woke up early this morning with a fever of 103, urgency and frequency.  She has a previous difficulty with UTIs.  She was seen by her transplant physician yesterday and apparently all the blood work looks good.   Review of Systems     Objective:   Physical Exam Alert and in no distress otherwise not examined       Assessment & Plan:  Acute cystitis without hematuria - Plan: sulfamethoxazole-trimethoprim (BACTRIM DS) 800-160 MG tablet  H/O kidney transplant Under the circumstances I think treatment is totally appropriate.  She will call if she has further difficulty.  Also recommended using Azo-Standard as well as Tylenol for the fever.

## 2018-12-31 ENCOUNTER — Other Ambulatory Visit: Payer: Self-pay

## 2018-12-31 DIAGNOSIS — Z20822 Contact with and (suspected) exposure to covid-19: Secondary | ICD-10-CM

## 2019-01-02 LAB — NOVEL CORONAVIRUS, NAA: SARS-CoV-2, NAA: NOT DETECTED

## 2021-11-15 ENCOUNTER — Encounter: Payer: Self-pay | Admitting: Internal Medicine

## 2021-12-19 ENCOUNTER — Encounter: Payer: Self-pay | Admitting: Internal Medicine

## 2022-01-01 ENCOUNTER — Encounter: Payer: Self-pay | Admitting: Internal Medicine

## 2022-01-04 LAB — HM MAMMOGRAPHY

## 2022-01-11 ENCOUNTER — Encounter: Payer: Self-pay | Admitting: Internal Medicine

## 2023-05-07 ENCOUNTER — Encounter: Payer: Self-pay | Admitting: Internal Medicine
